# Patient Record
Sex: Female | Born: 2000 | Race: Black or African American | Marital: Single
Health system: Southern US, Community
[De-identification: ages and names within clinical notes are randomized; demographics above are authoritative.]

---
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
## 2020-04-07 ENCOUNTER — Ambulatory Visit (HOSPITAL_COMMUNITY): Payer: Self-pay

## 2020-04-26 ENCOUNTER — Ambulatory Visit (HOSPITAL_COMMUNITY): Payer: Self-pay

## 2020-06-09 ENCOUNTER — Emergency Department (HOSPITAL_COMMUNITY)
Admission: EM | Admit: 2020-06-09 | Discharge: 2020-06-09 | Disposition: A | Discharge status: Home / Self Care | Payer: Medicaid Other | Attending: Emergency Medicine | Admitting: Emergency Medicine

## 2020-06-09 ENCOUNTER — Emergency Department (HOSPITAL_COMMUNITY): Payer: Medicaid Other

## 2020-06-09 ENCOUNTER — Encounter (HOSPITAL_COMMUNITY): Payer: Self-pay | Admitting: Emergency Medicine

## 2020-06-09 ENCOUNTER — Other Ambulatory Visit: Payer: Self-pay

## 2020-06-09 DIAGNOSIS — Y92410 Unspecified street and highway as the place of occurrence of the external cause: Secondary | ICD-10-CM | POA: Diagnosis not present

## 2020-06-09 DIAGNOSIS — Z23 Encounter for immunization: Secondary | ICD-10-CM | POA: Diagnosis not present

## 2020-06-09 DIAGNOSIS — W458XXA Other foreign body or object entering through skin, initial encounter: Secondary | ICD-10-CM | POA: Insufficient documentation

## 2020-06-09 DIAGNOSIS — Z87891 Personal history of nicotine dependence: Secondary | ICD-10-CM | POA: Insufficient documentation

## 2020-06-09 DIAGNOSIS — S60452A Superficial foreign body of right middle finger, initial encounter: Secondary | ICD-10-CM | POA: Diagnosis not present

## 2020-06-09 DIAGNOSIS — S60459A Superficial foreign body of unspecified finger, initial encounter: Secondary | ICD-10-CM

## 2020-06-09 DIAGNOSIS — S6991XA Unspecified injury of right wrist, hand and finger(s), initial encounter: Secondary | ICD-10-CM | POA: Diagnosis present

## 2020-06-09 MED ORDER — TETANUS-DIPHTH-ACELL PERTUSSIS 5-2.5-18.5 LF-MCG/0.5 IM SUSY
0.5000 mL | PREFILLED_SYRINGE | Freq: Once | INTRAMUSCULAR | Status: AC
  Administered 2020-06-09: 0.5 mL via INTRAMUSCULAR
  Filled 2020-06-09: qty 0.5

## 2020-06-09 MED ORDER — ACETAMINOPHEN 325 MG PO TABS
650.0000 mg | ORAL_TABLET | Freq: Once | ORAL | Status: AC
  Administered 2020-06-09: 650 mg via ORAL
  Filled 2020-06-09: qty 2

## 2020-06-09 MED ORDER — NAPROXEN 375 MG PO TABS: 375.0000 mg | ORAL_TABLET | Freq: Two times a day (BID) | ORAL | 0 refills | Status: AC

## 2020-06-09 MED ORDER — CEPHALEXIN 500 MG PO CAPS: 500.0000 mg | ORAL_CAPSULE | Freq: Four times a day (QID) | ORAL | 0 refills | Status: AC

## 2020-06-09 MED ORDER — LIDOCAINE HCL (PF) 1 % IJ SOLN
5.0000 mL | Freq: Once | INTRAMUSCULAR | Status: AC
  Administered 2020-06-09: 5 mL
  Filled 2020-06-09: qty 5
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
## 2020-06-09 NOTE — ED Notes (Signed)
Left the pt talking to registration  After I finished with another pt  She had left the area

## 2020-06-12 ENCOUNTER — Ambulatory Visit (HOSPITAL_COMMUNITY)
Admission: EM | Admit: 2020-06-12 | Discharge: 2020-06-12 | Disposition: A | Discharge status: Home / Self Care | Payer: Medicaid Other | Attending: Urgent Care | Admitting: Urgent Care

## 2020-06-12 ENCOUNTER — Encounter (HOSPITAL_COMMUNITY): Payer: Self-pay | Admitting: Emergency Medicine

## 2020-06-12 ENCOUNTER — Other Ambulatory Visit: Payer: Self-pay

## 2020-06-12 DIAGNOSIS — Z3202 Encounter for pregnancy test, result negative: Secondary | ICD-10-CM

## 2020-06-12 DIAGNOSIS — Z87891 Personal history of nicotine dependence: Secondary | ICD-10-CM | POA: Insufficient documentation

## 2020-06-12 DIAGNOSIS — M79601 Pain in right arm: Secondary | ICD-10-CM

## 2020-06-12 DIAGNOSIS — M7918 Myalgia, other site: Secondary | ICD-10-CM | POA: Insufficient documentation

## 2020-06-12 DIAGNOSIS — S20211A Contusion of right front wall of thorax, initial encounter: Secondary | ICD-10-CM | POA: Insufficient documentation

## 2020-06-12 DIAGNOSIS — Z7251 High risk heterosexual behavior: Secondary | ICD-10-CM

## 2020-06-12 LAB — POC URINE PREG, ED: Preg Test, Ur: NEGATIVE

## 2020-06-12 MED ORDER — TIZANIDINE HCL 4 MG PO TABS: 4.0000 mg | ORAL_TABLET | Freq: Every day | ORAL | 0 refills | Status: DC

## 2020-06-12 MED ORDER — IBUPROFEN 600 MG PO TABS: 600.0000 mg | ORAL_TABLET | Freq: Four times a day (QID) | ORAL | 0 refills | Status: DC | PRN
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
## 2020-06-14 LAB — CERVICOVAGINAL ANCILLARY ONLY
Chlamydia: NEGATIVE
Comment: NEGATIVE
Comment: NEGATIVE
Comment: NORMAL
Neisseria Gonorrhea: NEGATIVE
Trichomonas: POSITIVE — AB

## 2020-06-15 ENCOUNTER — Telehealth (HOSPITAL_COMMUNITY): Payer: Self-pay | Admitting: Emergency Medicine

## 2020-06-15 MED ORDER — METRONIDAZOLE 500 MG PO TABS: 500.0000 mg | ORAL_TABLET | Freq: Two times a day (BID) | ORAL | 0 refills | Status: DC

## 2020-06-19 ENCOUNTER — Telehealth (HOSPITAL_COMMUNITY): Payer: Self-pay | Admitting: Emergency Medicine

## 2020-06-19 NOTE — Telephone Encounter (Signed)
Patient returned third phone call.  Verified identity using two identifiers.  Provided positive result.  Reviewed safe sex practices, notifying partners, and refraining from sexual activities for 7 days from time of treatment.  Patient verified understanding, all questions answered.

## 2020-10-09 ENCOUNTER — Encounter (HOSPITAL_COMMUNITY): Payer: Self-pay | Admitting: Emergency Medicine
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
## 2021-01-21 ENCOUNTER — Ambulatory Visit (HOSPITAL_COMMUNITY)
Admission: EM | Admit: 2021-01-21 | Discharge: 2021-01-21 | Disposition: A | Discharge status: Home / Self Care | Payer: Medicaid Other | Attending: Emergency Medicine | Admitting: Emergency Medicine

## 2021-01-21 ENCOUNTER — Ambulatory Visit (HOSPITAL_COMMUNITY)
Admission: EM | Admit: 2021-01-21 | Discharge: 2021-01-21 | Disposition: A | Discharge status: Home / Self Care | Payer: Medicaid Other

## 2021-01-21 ENCOUNTER — Other Ambulatory Visit: Payer: Self-pay

## 2021-01-21 ENCOUNTER — Encounter (HOSPITAL_COMMUNITY): Payer: Self-pay

## 2021-01-21 DIAGNOSIS — F321 Major depressive disorder, single episode, moderate: Secondary | ICD-10-CM | POA: Diagnosis not present

## 2021-01-21 MED ORDER — HYDROXYZINE HCL 50 MG PO TABS: 50.0000 mg | ORAL_TABLET | Freq: Four times a day (QID) | ORAL | 1 refills | Status: DC | PRN
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
## 2021-01-21 NOTE — ED Triage Notes (Signed)
Pt c/o not feeling motivated to go to work (has not gone in 2 weeks now),  some days "I don;t want to be here", crying, "thinking too much". Pt reports feeling this way for about 2 weeks.  Pt unsure if this is related to her birth control.

## 2021-01-21 NOTE — ED Provider Notes (Signed)
HPI  SUBJECTIVE:  Mckenzie Clark is a 20 y.o. female who presents with feeling depressed for the past 2 weeks.  She states that she is very emotional, crying frequently.  She states that she has not had the energy to go to work for 2 weeks.  She reports insomnia, excess guilt, rumination. She reports loss of interest in usual activities.  Her grandmother died recently and she reports increased stress at her job.  She has signed up for counseling at work.  She has tried exercise and talking to family without improvement in her symptoms.  No aggravating factors.  She states that she wishes that "I was not here in the world" sometimes.  She adamantly denies wanting to kill or hurt her self or others.  No auditory or visual hallucinations.  She has a past medical history of depression and has never been on medications for this.  No history of suicide attempts, psychiatric admissions.  She is to cut her arms about a year and a half ago.  No history of thyroid disease, diabetes.  LMP: 5/31.  Denies the possibility being pregnant.  PMD: None.   History reviewed. No pertinent past medical history.  History reviewed. No pertinent surgical history.  Family History  Problem Relation Age of Onset   Healthy Mother    Healthy Father     Social History   Tobacco Use   Smoking status: Former    Pack years: 0.00   Smokeless tobacco: Never  Substance Use Topics   Alcohol use: Never   Drug use: Not Currently    No current facility-administered medications for this encounter.  Current Outpatient Medications:    hydrOXYzine (ATARAX/VISTARIL) 50 MG tablet, Take 1 tablet (50 mg total) by mouth every 6 (six) hours as needed for anxiety. May take 2 tabs at night, Disp: 30 tablet, Rfl: 1   medroxyPROGESTERone Acetate (DEPO-PROVERA IM), Inject into the muscle., Disp: , Rfl:   No Known Allergies   ROS  As noted in HPI.   Physical Exam  BP 119/61   Pulse 76   Temp 99 F (37.2 C)   Resp 18   LMP  01/07/2021 (Approximate)   SpO2 100%   Constitutional: Well developed, well nourished, no acute distress.  Well groomed, well nourished. Eyes:  EOMI, conjunctiva normal bilaterally HENT: Normocephalic, atraumatic,mucus membranes moist. Neck: No thyroid tenderness, thyromegaly Respiratory: Normal inspiratory effort, lungs clear bilaterally Cardiovascular: Normal rate, regular rhythm, no murmurs rubs or gallop GI: nondistended skin: No rash, skin intact Musculoskeletal: no deformities Neurologic: Alert & oriented x 3, no focal neuro deficits Psychiatric: Speech and behavior appropriate.  No pressured speech, no apparent auditory visual hallucinations.  Logical, goal oriented thinking.  No suicidal or homicidal ideations.   ED Course   Medications - No data to display  No orders of the defined types were placed in this encounter.   No results found for this or any previous visit (from the past 24 hour(s)). No results found.  ED Clinical Impression  1. Current moderate episode of major depressive disorder, unspecified whether recurrent [facility])      ED Assessment/Plan  Patient with major depression.  Patient adamantly states that she has no wish to kill or hurt herself, or other people.  she "just wants to not exist".  I discussed with her that we could try some Vistaril, will start at 50 mg, may cut in half if necessary, contract for safety, and have her go to the behavioral health  urgent care now, or I could send her to the ED for psychiatric evaluation.  She has opted for the former.  She has agreed to contract for safety and understands that this is a legally binding agreement.  She will go to behavioral health urgent care tonight.  He will go to the [facility] emergency department if she changes her mind.  Work note for 2 days so that she can seek help.  Discussed MDM, treatment plan, and plan for follow-up with patient. Discussed sn/sx that should prompt return to the ED. patient  agrees with plan.   Meds ordered this encounter  Medications   hydrOXYzine (ATARAX/VISTARIL) 50 MG tablet    Sig: Take 1 tablet (50 mg total) by mouth every 6 (six) hours as needed for anxiety. May take 2 tabs at night    Dispense:  30 tablet    Refill:  1      *This clinic note was created using Scientist, clinical (histocompatibility and immunogenetics). Therefore, there may be occasional mistakes despite careful proofreading.  ?    Domenick Gong, MD 01/22/21 [phone]

## 2021-01-21 NOTE — Discharge Instructions (Addendum)
Go to behavioral health urgent care tonight.  You need help, and I think that you will be able to get it sooner there.  You can try the Vistaril 50 mg, if it is too much, you may cut it in half.  This will help you sleep.  Below is a list of primary care practices who are taking new patients for you to follow-up with.  [facility] internal medicine clinic Ground Floor - [facility], [address] [place], [place], [place] [address] [phone]  [facility] Primary Care at [facility] [address] [place], [place] [address] [phone]  [facility] [address] [place], [place] [address] [phone]  [facility] [phone] [address] [place] [address]  [facility]: [address] [place] [address]  [phone]  [facility]: [address] [place] [address]  [phone]  [facility] : [address] [place] [address] [phone]  [facility] Primary Care: [address] [place] [address] [phone]  [facility]: [address] [place] [address] [phone]  Dr. Oneal Grout [address] [facility] [place] [address]  [phone]  Go to www.goodrx.com  or www.costplusdrugs.com to look up your medications. This will give you a list of where you can find your prescriptions at the most affordable prices. Or ask the pharmacist what the cash price is, or if they have any other discount programs available to help make your medication more affordable. This can be less expensive than what you would pay with insurance.

## 2021-01-21 NOTE — ED Notes (Signed)
Martyn Ehrich, patient access - reports patient left

## 2021-01-26 ENCOUNTER — Ambulatory Visit (HOSPITAL_COMMUNITY)
Admission: EM | Admit: 2021-01-26 | Discharge: 2021-01-26 | Disposition: A | Discharge status: Home / Self Care | Payer: Medicaid Other | Attending: Family | Admitting: Family

## 2021-01-26 ENCOUNTER — Other Ambulatory Visit: Payer: Self-pay

## 2021-01-26 ENCOUNTER — Encounter (HOSPITAL_COMMUNITY): Payer: Self-pay

## 2021-01-26 ENCOUNTER — Ambulatory Visit (HOSPITAL_COMMUNITY)
Admission: EM | Admit: 2021-01-26 | Discharge: 2021-01-26 | Disposition: A | Discharge status: Home / Self Care | Payer: Medicaid Other

## 2021-01-26 DIAGNOSIS — F3289 Other specified depressive episodes: Secondary | ICD-10-CM | POA: Diagnosis not present

## 2021-01-26 DIAGNOSIS — F339 Major depressive disorder, recurrent, unspecified: Secondary | ICD-10-CM | POA: Insufficient documentation

## 2021-01-26 DIAGNOSIS — G4700 Insomnia, unspecified: Secondary | ICD-10-CM | POA: Diagnosis not present

## 2021-01-26 DIAGNOSIS — F320 Major depressive disorder, single episode, mild: Secondary | ICD-10-CM

## 2021-01-26 MED ORDER — TRAZODONE HCL 50 MG PO TABS
50.0000 mg | ORAL_TABLET | Freq: Every day | ORAL | 0 refills | Status: DC
Start: 2021-01-26 — End: 2021-09-04

## 2021-01-26 MED ORDER — FLUOXETINE HCL 10 MG PO CAPS
10.0000 mg | ORAL_CAPSULE | Freq: Every day | ORAL | 0 refills | Status: DC
Start: 2021-01-26 — End: 2021-09-04

## 2021-01-26 MED ORDER — FLUOXETINE HCL 10 MG PO CAPS
10.0000 mg | ORAL_CAPSULE | Freq: Every day | ORAL | 0 refills | Status: DC
Start: 2021-01-26 — End: 2021-01-26

## 2021-01-26 NOTE — ED Triage Notes (Signed)
Pt in with c/o depression x 2 weeks  States that she doesn't want to kill herself , but "some times I just feel like I don't want to be in this world anymore"   Pt currently not on any medication  States her grandmother recently passed away   Pt states she feels like no one cares about her

## 2021-01-26 NOTE — Discharge Instructions (Addendum)
Take all medications as prescribed. Keep all follow-up appointments as scheduled.  Do not consume alcohol or use illegal drugs while on prescription medications. Report any adverse effects from your medications to your primary care provider promptly.  In the event of recurrent symptoms or worsening symptoms, call 911, a crisis hotline, or go to the nearest emergency department for evaluation.   

## 2021-01-26 NOTE — ED Provider Notes (Signed)
Behavioral Health Urgent Care Medical Screening Exam  Patient Name: Mckenzie Clark MRN: [id number] Date of Evaluation: 01/26/21 Chief Complaint:   Diagnosis:  Final diagnoses:  None    History of Present illness: Mckenzie Clark is a 20 y.o. female.  Presents due to worsening depression.  She reports " I have been depressed for quite a while."  Denied suicidal.  Denies plan or intent.  Does report passive thoughts.  Denied previous inpatient admissions.  Denied that she has been followed by psychiatry and/or therapy in the past.  Dates she was recently evaluated at [facility] urgent care due to depression and was advised to follow-up here.  States she has not been resting well at night.  Due to the passing of her grandmother which intensified her depression symptoms.  Reports a family history of mental illness.  States her brother, using drugs and now " talks to his self."  She denied illicit drug or substance abuse history.   Patient was initiated on hydroxyzine however states the medication has not been helpful.  Discussed following up with open access for medication management.  Will initiate Prozac 10 mg and hydroxyzine 50 mg p.o. nightly.  Patient to follow-up with Intensive Outpatient program.  Support, encouragement and reassurance was provided.  Psychiatric Specialty Exam  Presentation  General Appearance:Appropriate for Environment  Eye Contact:Good  Speech:Clear and Coherent  Speech Volume:Decreased  Handedness:Left   Mood and Affect  Mood:Anxious; Depressed  Affect:Congruent   Thought Process  Thought Processes:Coherent  Descriptions of Associations:Intact  Orientation:Full (Time, Place and Person)  Thought Content:Logical    Hallucinations:None  Ideas of Reference:None  Suicidal Thoughts:No (passive ideations, denied intent or plan)  Homicidal Thoughts:No   Sensorium  Memory:Immediate Fair; Recent Fair; Remote  Fair  Judgment:Fair  Insight:Good   Executive Functions  Concentration:Fair  Attention Span:Fair  Recall:Fair  Fund of Knowledge:Good  Language:Good   Psychomotor Activity  Psychomotor Activity:Normal   Assets  Assets:Social Support   Sleep  Sleep:Fair  Number of hours:  No data recorded  Nutritional Assessment (For OBS and FBC admissions only) Has the patient had a weight loss or gain of 10 pounds or more in the last 3 months?: No Has the patient had a decrease in food intake/or appetite?: No Does the patient have dental problems?: No Does the patient have eating habits or behaviors that may be indicators of an eating disorder including binging or inducing vomiting?: No Has the patient recently lost weight without trying?: No Has the patient been eating poorly because of a decreased appetite?: No Malnutrition Screening Tool Score: 0    Physical Exam: Physical Exam ROS Blood pressure 122/85, pulse 73, last menstrual period 01/07/2021, SpO2 100 %. There is no height or weight on file to calculate BMI.  Musculoskeletal: Strength & Muscle Tone: within normal limits Gait & Station: normal Patient leans: N/A   [facility] MSE Discharge Disposition for Follow up and Recommendations: Based on my evaluation the patient does not appear to have an emergency medical condition and can be discharged with resources and follow up care in outpatient services for Medication Management, Partial Hospitalization Program, and Group Therapy  - Patient to follow-up with Jeri Modena and start intensive outpatient programming (IOP) -Keep follow-up appointment with therapist Darren  -Patient was initiated on Prozac 10 mg  and Trazodone 50 mg   Oneta Rack, NP 01/26/2021, 3:09 PM

## 2021-01-26 NOTE — Progress Notes (Signed)
   01/26/21 1514  [facility] Triage Screening (Walk-ins at [facility] only)  How Did You Hear About [place]? Self  What Is the Reason for Your Visit/Call Today? Tahliyah is a 20yo female reporting to [facility] for evaluation of depression symptom and insomnia. Pt reports that she has been feeling more depressed since she started birth control pills a few months ago. Pt says she is experiencing the following symptoms of depression: sad, depressed mood, lack of motivation, lack of initiative. Pt feels that her symptoms are impacting her sleep and daily work/social functioning. Pt reports good support system: boyfriend and friends.  Pt reports some SI (thoughts with no plans), no HI, and no AVH.   Pt does not feel she is currently a danger to herself or others. Pt is interested in outpatient resources/treatment.  How Long Has This Been Causing You Problems? 1-6 months  Have You Recently Had Any Thoughts About Hurting Yourself? Yes  How long ago did you have thoughts about hurting yourself? recently had thoughts about "not being here"  Are You Planning to Commit Suicide/Harm Yourself At This time? No  Have you Recently Had Thoughts About Hurting Someone Karolee Ohs? No  Are You Planning To Harm Someone At This Time? No  Are you currently experiencing any auditory, visual or other hallucinations? No  Have You Used Any Alcohol or Drugs in the Past 24 Hours? No  Do you have any current medical co-morbidities that require immediate attention? No  What Do You Feel Would Help You the Most Today? Treatment for Depression or other mood problem  If access to [facility] Care was not available, would you have sought care in the Emergency Department? Yes  Determination of Need Routine (7 days)  Options For Referral Outpatient Therapy;Medication Management

## 2021-01-26 NOTE — Discharge Instructions (Addendum)
Go to [facility] Urgent Care

## 2021-01-26 NOTE — ED Provider Notes (Signed)
[facility]    CSN: [id number] Arrival date & time: 01/26/21  1355      History   Chief Complaint Chief Complaint  Patient presents with   Depression    HPI Mckenzie Clark is a 20 y.o. female.   Reports feeling depressed and low. States that she feels like no one really cares about her right now. States that her grandmother recently died and she has not been sleeping either. Denies previous symptoms. Has not attempted OTC treatment. Denies current SI/HI but does state that the world would be "better off without her." No plan to harm herself or others. Was seen in this office and treated with hydroxyzine for anxiety x 5 days ago. Reports that this has not been very helpful. Reports that she has enrolled in Employee Assistance Program to get into counseling. Reports that her appointment is not until next month. Denies current headache, chills, abdominal pain, nausea, vomiting, diarrhea, rash, fever, other symptoms.  ROS per HPI  The history is provided by the patient.  Depression   History reviewed. No pertinent past medical history.  There are no problems to display for this patient.   History reviewed. No pertinent surgical history.  OB History   No obstetric history on file.      Home Medications    Prior to Admission medications   Medication Sig Start Date End Date Taking? Authorizing Provider  hydrOXYzine (ATARAX/VISTARIL) 50 MG tablet Take 1 tablet (50 mg total) by mouth every 6 (six) hours as needed for anxiety. May take 2 tabs at night 01/21/21   Domenick Gong, MD  medroxyPROGESTERone Acetate (DEPO-PROVERA IM) Inject into the muscle.    [provider]    Family History Family History  Problem Relation Age of Onset   Healthy Mother    Healthy Father     Social History Social History   Tobacco Use   Smoking status: Former    Pack years: 0.00   Smokeless tobacco: Never  Substance Use Topics   Alcohol use: Never   Drug use:  Not Currently     Allergies   Patient has no known allergies.   Review of Systems Review of Systems  Psychiatric/Behavioral:  Positive for depression.     Physical Exam Triage Vital Signs ED Triage Vitals  Enc Vitals Group     BP 01/26/21 1405 123/75     Pulse Rate 01/26/21 1405 98     Resp 01/26/21 1405 17     Temp 01/26/21 1405 98.9 F (37.2 C)     Temp Source 01/26/21 1405 Oral     SpO2 01/26/21 1405 100 %     Weight --      Height --      Head Circumference --      Peak Flow --      Pain Score 01/26/21 1404 0     Pain Loc --      Pain Edu? --      Excl. in GC? --    No data found.  Updated Vital Signs BP 123/75 (BP Location: Right Arm)   Pulse 98   Temp 98.9 F (37.2 C) (Oral)   Resp 17   LMP 01/07/2021 (Approximate)   SpO2 100%   Visual Acuity Right Eye Distance:   Left Eye Distance:   Bilateral Distance:    Right Eye Near:   Left Eye Near:    Bilateral Near:     Physical Exam Vitals and nursing note  reviewed.  Constitutional:      General: She is not in acute distress.    Appearance: Normal appearance. She is well-developed.  HENT:     Head: Normocephalic and atraumatic.     Nose: Nose normal.     Mouth/Throat:     Mouth: Mucous membranes are moist.     Pharynx: Oropharynx is clear.  Eyes:     Extraocular Movements: Extraocular movements intact.     Conjunctiva/sclera: Conjunctivae normal.     Pupils: Pupils are equal, round, and reactive to light.  Cardiovascular:     Rate and Rhythm: Normal rate and regular rhythm.  Pulmonary:     Effort: Pulmonary effort is normal. No respiratory distress.  Musculoskeletal:        General: Normal range of motion.     Cervical back: Normal range of motion and neck supple.  Skin:    General: Skin is warm and dry.     Capillary Refill: Capillary refill takes less than 2 seconds.  Neurological:     General: No focal deficit present.     Mental Status: She is alert and oriented to person, place, and  time.  Psychiatric:        Mood and Affect: Mood normal.        Behavior: Behavior is withdrawn.        Thought Content: Thought content normal. Thought content is not paranoid or delusional. Thought content does not include homicidal or suicidal ideation. Thought content does not include homicidal or suicidal plan.     UC Treatments / Results  Labs (all labs ordered are listed, but only abnormal results are displayed) Labs Reviewed - No data to display  EKG   Radiology No results found.  Procedures Procedures (including critical care time)  Medications Ordered in UC Medications - No data to display  Initial Impression / Assessment and Plan / UC Course  I have reviewed the triage vital signs and the nursing notes.  Pertinent labs & imaging results that were available during my care of the patient were reviewed by me and considered in my medical decision making (see chart for details).    Depression Insomnia  Discussed that if the hydroxyzine was not helpful that she does need to be evaluated by behavioral health Discussed that there is a 24/7 behavioral health urgent care in town and that they should be able to see her today She is in agreement to follow up there Low concern for immediate risk to self or others Get established with PCP as well Follow up in the ER if symptoms worsen  Final Clinical Impressions(s) / UC Diagnoses   Final diagnoses:  Insomnia, unspecified type  Other depression     Discharge Instructions      Go to Behavioral Health Urgent Care     ED Prescriptions   None    PDMP not reviewed this encounter.   Moshe Cipro, NP 01/26/21 1437

## 2021-01-26 NOTE — Discharge Summary (Signed)
Mckenzie Clark to be D/C'd Home per NP order. Discussed with the patient and all questions fully answered. An After Visit Summary was printed and given to the patient.  Patient escorted out and D/C home via private auto.  Dickie La  01/26/2021 3:24 PM

## 2021-01-27 ENCOUNTER — Encounter: Payer: Self-pay | Admitting: *Deleted

## 2021-01-28 ENCOUNTER — Telehealth (HOSPITAL_COMMUNITY): Payer: Self-pay | Admitting: Psychiatry

## 2021-01-28 NOTE — Telephone Encounter (Signed)
D:  Hillery Jacks, NP referred pt to [facility]-IOP.  A:  Placed call to pt's cell #; a gentleman answered stating that pt wasn't available.  Left message that case manager from [facility] called.  Melton Krebs states he will inform her.  Inform Hillery Jacks, NP.

## 2021-01-31 ENCOUNTER — Ambulatory Visit (HOSPITAL_COMMUNITY): Payer: Self-pay | Admitting: Clinical

## 2021-03-18 ENCOUNTER — Other Ambulatory Visit: Payer: Self-pay

## 2021-03-18 DIAGNOSIS — Z79899 Other long term (current) drug therapy: Secondary | ICD-10-CM | POA: Insufficient documentation

## 2021-03-18 DIAGNOSIS — F331 Major depressive disorder, recurrent, moderate: Secondary | ICD-10-CM | POA: Insufficient documentation

## 2021-03-18 DIAGNOSIS — Z9152 Personal history of nonsuicidal self-harm: Secondary | ICD-10-CM | POA: Insufficient documentation

## 2021-03-18 NOTE — Progress Notes (Signed)
TRIAGE: ROUTINE   03/18/21 2100  [facility] Triage Screening (Walk-ins at [facility] only)  How Did You Hear About [place]? Self  What Is the Reason for Your Visit/Call Today? Pt shares, "I've been deprssed, feeing down. I have no energy to do anything. I'm moody. I'm having thoughts I don't want to be here. I haven't been to work in a couple of months; they say they understand but I don't want to lose my job. I had these thoughts before when I was [age] and on birth control. This time is started when I came back from a trip to [place] with my boyfriend; I got back and now I'm feeling down."  How Long Has This Been Causing You Problems? 1-6 months  Have You Recently Had Any Thoughts About Hurting Yourself? No  How long ago did you have thoughts about hurting yourself? N/A  Are You Planning to Commit Suicide/Harm Yourself At This time? No  Have you Recently Had Thoughts About Hurting Someone Karolee Ohs? No  Are You Planning To Harm Someone At This Time? No  Are you currently experiencing any auditory, visual or other hallucinations? No  Have You Used Any Alcohol or Drugs in the Past 24 Hours? No  Do you have any current medical co-morbidities that require immediate attention? No  Clinician description of patient physical appearance/behavior: Pt is dressed in an age-appropriate manner. She is able to express her thoughts, feelings, and concerns in an open manner.  What Do You Feel Would Help You the Most Today? Treatment for Depression or other mood problem;Medication(s)  If access to [facility] Urgent Care was not available, would you have sought care in the Emergency Department? No  Determination of Need Routine (7 days)  Options For Referral Medication Management;Outpatient Therapy

## 2021-03-18 NOTE — Progress Notes (Deleted)
Pt left the lobby without being seen by provider or TTS.

## 2021-03-19 ENCOUNTER — Ambulatory Visit (HOSPITAL_COMMUNITY)
Admission: EM | Admit: 2021-03-19 | Discharge: 2021-03-19 | Disposition: A | Discharge status: Home / Self Care | Payer: Medicaid Other | Attending: Psychiatry | Admitting: Psychiatry

## 2021-03-19 ENCOUNTER — Encounter (HOSPITAL_COMMUNITY): Payer: Self-pay

## 2021-03-19 DIAGNOSIS — F331 Major depressive disorder, recurrent, moderate: Secondary | ICD-10-CM

## 2021-03-19 NOTE — Discharge Instructions (Signed)
  Discharge recommendations:  Patient is to take medications as prescribed. Please see information for follow-up appointment with psychiatry and therapy. Please follow up with your primary care provider for all medical related needs.   Therapy: We recommend that patient participate in individual therapy to address mental health concerns.  Medications: The patient is to contact a medical professional and/or outpatient provider to address any new side effects that develop. Patient should update outpatient providers of any new medications and/or medication changes.   Safety:  The patient should abstain from use of illicit substances/drugs and abuse of any medications. If symptoms worsen or do not continue to improve or if the patient becomes actively suicidal or homicidal then it is recommended that the patient return to the closest hospital emergency department, the [facility], or call 911 for further evaluation and treatment. National Suicide Prevention Lifeline 1-800-SUICIDE or 1-800-273-8255.  

## 2021-03-19 NOTE — ED Provider Notes (Addendum)
Behavioral Health Urgent Care Medical Screening Exam  Patient Name: Mckenzie Clark MRN: [id number] Date of Evaluation: 03/19/21 Chief Complaint:  "Feeling down" Diagnosis:  Final diagnoses:  MDD (major depressive disorder), recurrent episode, moderate (HCC)    History of Present illness: Mckenzie Clark is a 20 y.o. female with past documented psychiatric history of MDD who presents to [facility] unaccompanied as a voluntary walk-in.  When patient is asked why she came to [facility], patient states "I've been feeling down lately, depressed, I have no energy to go to work. I have been sleeping a lot lately. I'm just not myself".  Patient states that she began feeling depressed and anxious about 2 months ago after she returned from a vacation with her boyfriend.  Patient states that there were no specific triggers on this vacation or after this trip that she believes caused her to begin feeling depressed and anxious.  Patient does state that she has been on a break from her boyfriend for the past 2 weeks.  Patient denies any history of being physically or verbally or sexually abused by her boyfriend.  Patient states that she has been to [facility] in the past and was prescribed medication for depression at that time.  Per chart review of this patient's chart (under Luther Bradley), there is no documentation of any past [facility] encounters for the patient. Upon further investigation, it appears that a second chart was created for the patient this evening by mistake, as my chart review shows that the patient has a separate/second chart (under Arlyn Leak, same birthdate as this current chart).  Patient's second chart has been marked for merge with this current chart that this encounter is documented under. Upon chart review of this second chart, it appears that patient presented to [facility] on 01/26/2021 for depression/anxiety and was initiated on/provided prescriptions for fluoxetine 10 mg p.o. daily and trazodone 50 mg p.o. daily at  bedtime.  Chart review of this 01/26/2021 behavioral health urgent care encounter also shows that patient's information was provided to [facility] IOP program. Per chart review, it appears that patient was contacted via phone by [facility] program regarding the IOP program, but it was documented that a female answered the phone and stated that the patient was not available at that time.  Patient states that she does not recall receiving a phone call from [facility] behavioral health and she states that she is using her mother's phone currently and that that may be why.  Patient states that she did pick up the fluoxetine 10 mg from her pharmacy at that time, but she states that she did not pick up the trazodone from her pharmacy. She states she is not sure why she did not pick up the Trazodone. Patient states that she took the fluoxetine 10 mg daily for a few days after picking up the prescription in June 2022, the patient states that she stopped taking the medication after only taking it for a few days in June.  Patient states that she stopped taking the fluoxetine because her mother told her that she did not need to be taking any medication for mental health.  Patient states that her mother has a negative stigma towards mental health and tells the patient that she "just needs to pray" and that the patient does not need to take medication for mental health or see a therapist.  Patient states that she still has most of her fluoxetine prescription at home.  She denies experiencing any adverse  effects while she is taking the fluoxetine, but does acknowledge that she did not take the medication long enough to determine if it was helping her mental health symptoms or not.  Chart review of patient's second chart (under Arlyn Leak) also shows that patient went to [facility] urgent care on 02/18/2021 for symptoms of depression and was recommended by the urgent care provider to take the  medication she was prescribed at the behavior of the urgent care on 01/26/2021 (see above for details) as well as to follow up with outpatient psychiatric provider.  Patient denies SI.  She does endorse experiencing mild passive SI 2 days ago with passive thoughts of not wanting to be alive, but patient denies any active SI or suicidal plans or suicidal intent.  Patient denies history of past suicide attempts.  Patient reports history of self-injurious behavior via cutting, but patient states that she has not cut herself or engaged in any self-injurious behavior for 1 year now.  Patient states that the last time she engaged in self-injurious behaviors when she cut her left forearm 1 year ago with a kitchen knife (she states that she cut herself for a few days 1 year ago and then completely stopped).  She states that her intention behind cutting herself at that time was to make herself feel better/relieve stress and she states that none of these cutting incidents were suicide attempts.  She denies any history of intentionally burning herself.  Patient denies HI, AVH, paranoia, or delusions.  Patient reports sleeping poorly and states that sometimes she only sleeps about 1 hour per night due to her depression and anxiety.  She endorses anhedonia, as well as feelings of hopelessness.  She reports fatigue and denies concentration changes.  She reports decreased appetite, but she attributes this to being off of her birth control for the past month.  She denies weight changes.  Patient is not taking any psychotropic medications at this time.  Patient does not have a psychiatrist or therapist at this time.  Patient denies any history of being psychiatrically hospitalized in the past.  She reports that she lives in [place] currently with her mother, 63-year-old brother, and maternal grandfather.  Patient denies access to firearms or weapons.  Patient reports drinking alcohol a few times per month and she states that she  last drank alcohol a few weeks ago.  She denies history of alcohol withdrawal symptoms, delirium tremens, or seizures.  She denies alcohol or illicit drug use.  She states that her main sources of support are her sisters, her friend, and her mother occasionally, although she does endorse that her mother has a negative stigma towards mental health (see details above).  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  Patient states that she has not been to work for about 1 month now due to her struggles with her depression and anxiety.  Patient states that she did go to work on July 1 and she states that prior to July 1 she had been out of work for about 1 month as well due to going on vacation and also for depression and anxiety.  Patient states that she still does have her job and that her work has been contacted and are asking her when she will be able to return.  She states that she feels like she can return to work and that she would like to return to work on Thursday,  March 21, 2021 as she states that Tuesdays and Wednesdays are usually her off days.  Psychiatric Specialty Exam  Presentation  General Appearance:Appropriate for Environment; Well Groomed  Eye Contact:Good  Speech:Clear and Coherent; Normal Rate  Speech Volume:Decreased  Handedness:No data recorded  Mood and Affect  Mood:Depressed  Affect:Congruent; Flat   Thought Process  Thought Processes:Coherent; Goal Directed; Linear  Descriptions of Associations:Intact  Orientation:Full (Time, Place and Person)  Thought Content:WDL; Logical    Hallucinations:None  Ideas of Reference:None  Suicidal Thoughts:No  Homicidal Thoughts:No   Sensorium  Memory:Immediate Fair; Recent Fair; Remote Fair  Judgment:Good  Insight:Good   Executive Functions  Concentration:Good  Attention Span:Good  Recall:Good  Fund of Knowledge:Good  Language:Good   Psychomotor Activity   Psychomotor Activity:Normal   Assets  Assets:Communication Skills; Desire for Improvement; Financial Resources/Insurance; Housing; Leisure Time; Physical Health; Social Support; Vocational/Educational; Transportation   Sleep  Sleep:Poor  Number of hours:  No data recorded  No data recorded  Physical Exam: Physical Exam Vitals reviewed.  Constitutional:      General: She is not in acute distress.    Appearance: Normal appearance. She is not ill-appearing, toxic-appearing or diaphoretic.  HENT:     Head: Normocephalic and atraumatic.     Right Ear: External ear normal.     Left Ear: External ear normal.     Nose: Nose normal.  Eyes:     General:        Right eye: No discharge.        Left eye: No discharge.     Conjunctiva/sclera: Conjunctivae normal.  Cardiovascular:     Rate and Rhythm: Normal rate.  Pulmonary:     Effort: Pulmonary effort is normal. No respiratory distress.  Musculoskeletal:        General: Normal range of motion.     Cervical back: Normal range of motion.  Neurological:     General: No focal deficit present.     Mental Status: She is alert and oriented to person, place, and time.     Comments: No tremor noted.   Psychiatric:        Mood and Affect: Mood is depressed.        Speech: Speech normal.        Behavior: Behavior normal. Behavior is not agitated, slowed, aggressive, withdrawn, hyperactive or combative. Behavior is cooperative.        Thought Content: Thought content is not paranoid or delusional. Thought content does not include homicidal or suicidal ideation.     Comments: Affect mood congruent and flat.    Review of Systems  Constitutional:  Positive for malaise/fatigue. Negative for chills, diaphoresis, fever and weight loss.  HENT:  Negative for congestion.   Respiratory:  Negative for cough and shortness of breath.   Cardiovascular:  Negative for chest pain and palpitations.  Gastrointestinal:  Negative for abdominal pain,  constipation, diarrhea, nausea and vomiting.  Musculoskeletal:  Negative for joint pain and myalgias.  Neurological:  Negative for dizziness, tremors, seizures and headaches.  Psychiatric/Behavioral:  Positive for depression. Negative for hallucinations, memory loss, substance abuse and suicidal ideas. The patient is nervous/anxious and has insomnia.   All other systems reviewed and are negative.  Vitals: Blood pressure 116/75, pulse 72, temperature 98.4 F (36.9 C), temperature source Oral, resp. rate 16, SpO2 100 %. There is no height or weight on file to calculate BMI.  Musculoskeletal: Strength & Muscle Tone: within normal limits Gait & Station: normal Patient leans: N/A  [facility] MSE Discharge Disposition for Follow up and Recommendations: Based on my evaluation the patient does not appear to have an emergency medical condition and can be discharged with resources and follow up care in outpatient services for Medication Management, Individual Therapy, and Group Therapy    Patient denies SI, HI, AVH, paranoia, or delusions.  Patient is not psychotic on exam.  Patient is not an imminent threat/risk to herself or others.  Patient does not meet inpatient psychiatric treatment criteria, overnight observation criteria, or [facility] FBC criteria.   Patient is not currently established with an outpatient psychiatrist or therapist and is not taking any psychotropic medications at this time.  Patient states that she did pick up the fluoxetine 10 mg from her pharmacy that she was prescribed at [facility] on 01/26/21, but she states that she did not pick up the trazodone from her pharmacy.  Patient states that she took the fluoxetine 10 mg daily for a few days after picking up the prescription in June 2022, the patient states that she stopped taking the medication after only taking it for a few days in June.  Patient states that she stopped taking the fluoxetine because her mother told her that she did not need to  be taking any medication for mental health.  Patient states that her mother has a negative stigma towards mental health and tells the patient that she "just needs to pray" and that the patient does not need to take medication for mental health or see a therapist.  Patient states that she still has most of her fluoxetine prescription at home.  She denies experiencing any adverse effects while she is taking the fluoxetine, but does acknowledge that she did not take the medication long enough to determine if it was helping her mental health symptoms or not.  Recommend that patient become established with an outpatient psychiatrist and therapist.  Patient states that she would like to become established with a psychiatrist and therapist. I discussed the following with the patient at length: the validity of patient's mental health issues, that it is completely fine and acceptable to have mental health struggles, provided support and reassurance regarding patient's doubts regarding her mental health, and the importance of the patient doing what is in the best interest of her health and mental health despite the negative stigma that her mother has towards mental health.  Patient verbalized understanding and agreement of this.  Patient lives in [place] and has [place].  Thus, patient meets criteria for [facility] open access hours.  Due to patient not being able to follow up with IOP in the past as well as her limited psychotropic medication compliance in the recent past, recommend that patient attend [facility] second-floor open access/walk-in hours for psychiatry/medication management and therapy appointments in order to become established with outpatient psychiatric providers, as this appears to be the quickest option for patient to begin receiving outpatient psychiatric care at this time.  Patient is agreeable to going to open access hours at  [facility] second-floor for psychiatry and therapy and she states that she will attend open access hours on 03/19/21 or 03/20/21 before she returns to work on 03/21/2021.  [facility] Monday through Friday open access/walk-in schedule/hours provided to the patient.  Recommend that patient restart her fluoxetine 10 mg p.o. daily as previously prescribed on 01/26/2021 until patient can be seen by an outpatient psychiatrist at [facility] open access hours and can then discuss continuing or adjusting this psychotropic medication regimen at that time of outpatient evaluation.  Patient reeducated on side effect profile fluoxetine and patient verbalized understanding of this education.  Patient agreeable to restarting fluoxetine.  Notified the patient that patient could discuss additional medications for sleep/anxiety with outpatient psychiatric provider at second-floor open access hours and patient verbalized understanding and agreement of this plan.  Additional [facility] of [facility] provided to the patient for the patient to utilize to schedule psychiatry and therapy appointments in the case that patient is unable to [facility].  Patient states that she feels like she can return to work and that she would like to return to work on Thursday, March 21, 2021 as she states that Tuesdays and Wednesdays are usually her off days.  Patient is asking for a work excuse stating that she can return to work on 03/21/2021.  With recommendations noted above and safety planning in place, I feel safe letting the patient return to work on 03/21/21 at this time with my above recommendations and safety planning (below) in place.  Work excuse provided for the patient stating [facility] and may return to work on 03/21/2021.   Patient  verbally contracts for safety with this Clinical research associatewriter.  Patient states that if she is to return home this evening, she will not try to harm or kill herself. Patient denies any safety concerns regarding returning home at this time.   Safety planning done at length with the patient regarding appropriate actions to take/resources to utilize [facility], nearest ED, 911, suicide prevention Lifeline) if the patient becomes suicidal or homicidal, if the patient's condition rapidly deteriorates/worsens/does not improve, or if the patient begins to experience a mental health crisis.   Patient verbalizes understanding and agreement of the overall plan and recommendations, including safety plan. All patient's questions answered and concerns addressed. Patient discharged home.     Jaclyn Shaggyody W Kimm Ungaro, PA-C 03/19/2021, 1:38 AM

## 2021-04-15 ENCOUNTER — Other Ambulatory Visit: Payer: Self-pay

## 2021-04-15 ENCOUNTER — Ambulatory Visit (HOSPITAL_COMMUNITY)
Admission: EM | Admit: 2021-04-15 | Discharge: 2021-04-15 | Disposition: A | Discharge status: Home / Self Care | Payer: Medicaid Other | Attending: Student | Admitting: Student

## 2021-04-15 ENCOUNTER — Encounter (HOSPITAL_COMMUNITY): Payer: Self-pay

## 2021-04-15 DIAGNOSIS — Z113 Encounter for screening for infections with a predominantly sexual mode of transmission: Secondary | ICD-10-CM | POA: Diagnosis not present

## 2021-04-15 DIAGNOSIS — N760 Acute vaginitis: Secondary | ICD-10-CM | POA: Diagnosis not present

## 2021-04-15 MED ORDER — FLUCONAZOLE 150 MG PO TABS: 150.0000 mg | ORAL_TABLET | Freq: Every day | ORAL | 0 refills | Status: DC

## 2021-04-15 NOTE — ED Triage Notes (Signed)
Pt reports vaginal itchy x 2-3 days.  Pt requested STD's test.

## 2021-04-15 NOTE — Discharge Instructions (Addendum)
-  For your yeast infection, start the Diflucan (fluconazole)- Take one pill today (day 1). If you're still having symptoms in 3 days, take the second pill.  -We have sent testing for sexually transmitted infections. We will notify you of any positive results once they are received. If required, we will prescribe any medications you might need. Please refrain from all sexual activity until treatment is complete.  -Seek additional medical attention if you develop fevers/chills, new/worsening abdominal pain, new/worsening vaginal discomfort/discharge, etc.   

## 2021-04-15 NOTE — ED Provider Notes (Signed)
[facility]    CSN: [id number] Arrival date & time: 04/15/21  1531      History   Chief Complaint Chief Complaint  Patient presents with   Vaginal Itching    HPI Mckenzie Clark is a 20 y.o. female presenting with vaginal itching x3 days, requesting STI screen. Medical history trichomonas 06/2020, BV. Denies hematuria, dysuria, frequency, urgency, back pain, n/v/d/abd pain, fevers/chills, abdnormal vaginal discharge, vaginal rashes/lesions. Denies new partners. LMP 1 week ago, states she is not pregnant.    HPI  History reviewed. No pertinent past medical history.  There are no problems to display for this patient.   History reviewed. No pertinent surgical history.  OB History   No obstetric history on file.      Home Medications    Prior to Admission medications   Medication Sig Start Date End Date Taking? Authorizing Provider  fluconazole (DIFLUCAN) 150 MG tablet Take 1 tablet (150 mg total) by mouth daily. -For your yeast infection, start the Diflucan (fluconazole)- Take one pill today (day 1). If you're still having symptoms in 3 days, take the second pill. 04/15/21  Yes Rhys Martini, PA-C  FLUoxetine (PROZAC) 10 MG capsule Take 1 capsule (10 mg total) by mouth daily. 01/26/21 01/26/22  Oneta Rack, NP  hydrOXYzine (ATARAX/VISTARIL) 50 MG tablet Take 1 tablet (50 mg total) by mouth every 6 (six) hours as needed for anxiety. May take 2 tabs at night 01/21/21   Domenick Gong, MD  medroxyPROGESTERone Acetate (DEPO-PROVERA IM) Inject into the muscle.    [provider]  traZODone (DESYREL) 50 MG tablet Take 1 tablet (50 mg total) by mouth at bedtime. 01/26/21   Oneta Rack, NP    Family History Family History  Problem Relation Age of Onset   Healthy Mother    Healthy Father     Social History Social History   Tobacco Use   Smoking status: Former   Smokeless tobacco: Never  Substance Use Topics   Alcohol use: Never   Drug use:  Not Currently     Allergies   Patient has no known allergies.   Review of Systems Review of Systems  Constitutional:  Negative for chills and fever.  HENT:  Negative for sore throat.   Eyes:  Negative for pain and redness.  Respiratory:  Negative for shortness of breath.   Cardiovascular:  Negative for chest pain.  Gastrointestinal:  Negative for abdominal pain, diarrhea, nausea and vomiting.  Genitourinary:  Negative for decreased urine volume, difficulty urinating, dysuria, flank pain, frequency, genital sores, hematuria, urgency, vaginal bleeding, vaginal discharge and vaginal pain.  Musculoskeletal:  Negative for back pain.  Skin:  Negative for rash.  All other systems reviewed and are negative.   Physical Exam Triage Vital Signs ED Triage Vitals  Enc Vitals Group     BP 04/15/21 1626 109/61     Pulse Rate 04/15/21 1626 82     Resp 04/15/21 1626 16     Temp 04/15/21 1626 99 F (37.2 C)     Temp Source 04/15/21 1626 Oral     SpO2 04/15/21 1626 96 %     Weight --      Height --      Head Circumference --      Peak Flow --      Pain Score 04/15/21 1624 0     Pain Loc --      Pain Edu? --      Excl.  in GC? --    No data found.  Updated Vital Signs BP 109/61 (BP Location: Right Arm)   Pulse 82   Temp 99 F (37.2 C) (Oral)   Resp 16   LMP  (Within Weeks) Comment: 1 week  SpO2 96%   Visual Acuity Right Eye Distance:   Left Eye Distance:   Bilateral Distance:    Right Eye Near:   Left Eye Near:    Bilateral Near:     Physical Exam Vitals reviewed.  Constitutional:      General: She is not in acute distress.    Appearance: Normal appearance. She is not ill-appearing.  HENT:     Head: Normocephalic and atraumatic.     Mouth/Throat:     Mouth: Mucous membranes are moist.     Comments: Moist mucous membranes Eyes:     Extraocular Movements: Extraocular movements intact.     Pupils: Pupils are equal, round, and reactive to light.  Cardiovascular:      Rate and Rhythm: Normal rate and regular rhythm.     Heart sounds: Normal heart sounds.  Pulmonary:     Effort: Pulmonary effort is normal.     Breath sounds: Normal breath sounds. No wheezing, rhonchi or rales.  Abdominal:     General: Bowel sounds are normal. There is no distension.     Palpations: Abdomen is soft. There is no mass.     Tenderness: There is no abdominal tenderness. There is no right CVA tenderness, left CVA tenderness, guarding or rebound.  Genitourinary:    Comments: deferred Skin:    General: Skin is warm.     Capillary Refill: Capillary refill takes less than 2 seconds.     Comments: Good skin turgor  Neurological:     General: No focal deficit present.     Mental Status: She is alert and oriented to person, place, and time.  Psychiatric:        Mood and Affect: Mood normal.        Behavior: Behavior normal.     UC Treatments / Results  Labs (all labs ordered are listed, but only abnormal results are displayed) Labs Reviewed  CERVICOVAGINAL ANCILLARY ONLY    EKG   Radiology No results found.  Procedures Procedures (including critical care time)  Medications Ordered in UC Medications - No data to display  Initial Impression / Assessment and Plan / UC Course  I have reviewed the triage vital signs and the nursing notes.  Pertinent labs & imaging results that were available during my care of the patient were reviewed by me and considered in my medical decision making (see chart for details).     This patient is a very pleasant 20 y.o. year old female presenting with suspected vaginitis. Afebrile, nontachycardic, no reproducible abd pain or CVAT. States she is not pregnant or breastfeeding, LMP 1 week ago.  History trichomonas per chart review.  Denies STI risk. Will send self-swab for G/C, trich, yeast, BV testing. Declines HIV, RPR. Safe sex precautions.   Diflucan sent as below.  ED return precautions discussed. Patient verbalizes  understanding and agreement.   Coding Level 4 for review of past notes/labs, order and interpretation of labs today, and prescription drug management  Final Clinical Impressions(s) / UC Diagnoses   Final diagnoses:  Vaginitis and vulvovaginitis  Routine screening for STI (sexually transmitted infection)     Discharge Instructions      -For your yeast infection, start the Diflucan (fluconazole)-  Take one pill today (day 1). If you're still having symptoms in 3 days, take the second pill.  -We have sent testing for sexually transmitted infections. We will notify you of any positive results once they are received. If required, we will prescribe any medications you might need. Please refrain from all sexual activity until treatment is complete.  -Seek additional medical attention if you develop fevers/chills, new/worsening abdominal pain, new/worsening vaginal discomfort/discharge, etc.       ED Prescriptions     Medication Sig Dispense Auth. Provider   fluconazole (DIFLUCAN) 150 MG tablet Take 1 tablet (150 mg total) by mouth daily. -For your yeast infection, start the Diflucan (fluconazole)- Take one pill today (day 1). If you're still having symptoms in 3 days, take the second pill. 2 tablet Rhys Martini, PA-C      PDMP not reviewed this encounter.   Rhys Martini, PA-C 04/15/21 1752

## 2021-04-16 LAB — CERVICOVAGINAL ANCILLARY ONLY
Bacterial Vaginitis (gardnerella): POSITIVE — AB
Comment: NEGATIVE

## 2021-04-19 ENCOUNTER — Telehealth (HOSPITAL_COMMUNITY): Payer: Self-pay

## 2021-04-19 MED ORDER — METRONIDAZOLE 500 MG PO TABS: 500.0000 mg | ORAL_TABLET | Freq: Two times a day (BID) | ORAL | 0 refills | Status: DC

## 2021-04-26 ENCOUNTER — Ambulatory Visit (HOSPITAL_COMMUNITY)
Admission: EM | Admit: 2021-04-26 | Discharge: 2021-04-26 | Disposition: A | Discharge status: Home / Self Care | Payer: Medicaid Other | Attending: Emergency Medicine | Admitting: Emergency Medicine

## 2021-04-26 ENCOUNTER — Other Ambulatory Visit: Payer: Self-pay

## 2021-04-26 ENCOUNTER — Encounter (HOSPITAL_COMMUNITY): Payer: Self-pay

## 2021-04-26 DIAGNOSIS — Z20822 Contact with and (suspected) exposure to covid-19: Secondary | ICD-10-CM | POA: Insufficient documentation

## 2021-04-26 DIAGNOSIS — J069 Acute upper respiratory infection, unspecified: Secondary | ICD-10-CM | POA: Insufficient documentation

## 2021-04-26 NOTE — Discharge Instructions (Signed)
Please follow the enclosed instructions for care.  If you begin to experience fever, worsening of symptoms, shortness of breath, please return for evaluation and treatment.

## 2021-04-26 NOTE — ED Triage Notes (Signed)
Pt presents with a runny nose and cough x 1 week.   Pt states she has not taken medicine for relief.

## 2021-04-26 NOTE — ED Provider Notes (Signed)
[facility]    CSN: [id number] Arrival date & time: 04/26/21  1945      History   Chief Complaint Chief Complaint  Patient presents with   Nasal Congestion   Sore Throat    HPI Mckenzie Clark is a 20 y.o. female.    Sore Throat  Pt presents with a runny nose, sore throat and nonproductive cough cough x 1 week. Pt states she has not taken medicine for relief.  Patient states that overall she is feeling better at this time, states she needs a note for work and would like to be tested for COVID. History reviewed. No pertinent past medical history.  There are no problems to display for this patient.   History reviewed. No pertinent surgical history.  OB History   No obstetric history on file.      Home Medications    Prior to Admission medications   Medication Sig Start Date End Date Taking? Authorizing Provider  fluconazole (DIFLUCAN) 150 MG tablet Take 1 tablet (150 mg total) by mouth daily. -For your yeast infection, start the Diflucan (fluconazole)- Take one pill today (day 1). If you're still having symptoms in 3 days, take the second pill. 04/15/21   Rhys Martini, PA-C  FLUoxetine (PROZAC) 10 MG capsule Take 1 capsule (10 mg total) by mouth daily. 01/26/21 01/26/22  Oneta Rack, NP  hydrOXYzine (ATARAX/VISTARIL) 50 MG tablet Take 1 tablet (50 mg total) by mouth every 6 (six) hours as needed for anxiety. May take 2 tabs at night 01/21/21   Domenick Gong, MD  medroxyPROGESTERone Acetate (DEPO-PROVERA IM) Inject into the muscle.    [provider]  metroNIDAZOLE (FLAGYL) 500 MG tablet Take 1 tablet (500 mg total) by mouth 2 (two) times daily. 04/19/21   Merrilee Jansky, MD  traZODone (DESYREL) 50 MG tablet Take 1 tablet (50 mg total) by mouth at bedtime. 01/26/21   Oneta Rack, NP    Family History Family History  Problem Relation Age of Onset   Healthy Mother    Healthy Father     Social History Social History   Tobacco Use    Smoking status: Former   Smokeless tobacco: Never  Substance Use Topics   Alcohol use: Never   Drug use: Not Currently     Allergies   Patient has no known allergies.   Review of Systems Review of Systems Per HPI  Physical Exam Triage Vital Signs ED Triage Vitals  Enc Vitals Group     BP 04/26/21 2014 (!) 117/91     Pulse Rate 04/26/21 2013 94     Resp 04/26/21 2013 19     Temp 04/26/21 2014 99.1 F (37.3 C)     Temp Source 04/26/21 2014 Oral     SpO2 04/26/21 2013 97 %     Weight --      Height --      Head Circumference --      Peak Flow --      Pain Score 04/26/21 2012 0     Pain Loc --      Pain Edu? --      Excl. in GC? --    No data found.  Updated Vital Signs BP (!) 117/91 (BP Location: Right Arm)   Pulse 94   Temp 99.1 F (37.3 C) (Oral)   Resp 19   LMP 03/27/2021 (Approximate)   SpO2 97%   Visual Acuity Right Eye Distance:   Left  Eye Distance:   Bilateral Distance:    Right Eye Near:   Left Eye Near:    Bilateral Near:     Physical Exam Constitutional:      Appearance: She is well-developed.  HENT:     Head: Normocephalic and atraumatic.     Right Ear: Tympanic membrane and ear canal normal.     Left Ear: Tympanic membrane and ear canal normal.     Nose: Rhinorrhea present. No congestion.     Mouth/Throat:     Mouth: Mucous membranes are moist.     Pharynx: Oropharynx is clear. No pharyngeal swelling, oropharyngeal exudate, posterior oropharyngeal erythema or uvula swelling.     Tonsils: No tonsillar exudate or tonsillar abscesses. 0 on the right. 0 on the left.  Eyes:     Conjunctiva/sclera: Conjunctivae normal.     Pupils: Pupils are equal, round, and reactive to light.  Cardiovascular:     Rate and Rhythm: Normal rate and regular rhythm.     Heart sounds: Normal heart sounds.  Pulmonary:     Effort: Pulmonary effort is normal.     Breath sounds: Normal breath sounds. No wheezing, rhonchi or rales.  Musculoskeletal:     Cervical  back: Normal range of motion and neck supple.  Lymphadenopathy:     Cervical: No cervical adenopathy.  Neurological:     General: No focal deficit present.     Mental Status: She is alert and oriented to person, place, and time.  Psychiatric:        Mood and Affect: Mood normal.        Behavior: Behavior normal.     UC Treatments / Results  Labs (all labs ordered are listed, but only abnormal results are displayed) Labs Reviewed  SARS CORONAVIRUS 2 (TAT 6-24 HRS)    EKG   Radiology No results found.  Procedures Procedures (including critical care time)  Medications Ordered in UC Medications - No data to display  Initial Impression / Assessment and Plan / UC Course  I have reviewed the triage vital signs and the nursing notes.  Pertinent labs & imaging results that were available during my care of the patient were reviewed by me and considered in my medical decision making (see chart for details).     Patient appears to be suffering from a mild upper respiratory infection at this time which is currently resolving.  Patient asked to be tested for COVID-19.  Patient also asked for a note for work.  Conservative care is recommended.  Note has been provided Final Clinical Impressions(s) / UC Diagnoses   Final diagnoses:  Viral upper respiratory tract infection     Discharge Instructions      Please follow the enclosed instructions for care.  If you begin to experience fever, worsening of symptoms, shortness of breath, please return for evaluation and treatment.     ED Prescriptions   None    PDMP not reviewed this encounter.   Theadora Rama Scales, PA-C 04/26/21 2130

## 2021-04-27 LAB — SARS CORONAVIRUS 2 (TAT 6-24 HRS): SARS Coronavirus 2: NEGATIVE

## 2021-06-19 ENCOUNTER — Encounter (HOSPITAL_COMMUNITY): Payer: Self-pay | Admitting: Emergency Medicine

## 2021-06-19 ENCOUNTER — Other Ambulatory Visit: Payer: Self-pay

## 2021-06-19 ENCOUNTER — Ambulatory Visit (HOSPITAL_COMMUNITY)
Admission: EM | Admit: 2021-06-19 | Discharge: 2021-06-19 | Disposition: A | Discharge status: Home / Self Care | Payer: Medicaid Other | Attending: Family Medicine | Admitting: Family Medicine

## 2021-06-19 DIAGNOSIS — N898 Other specified noninflammatory disorders of vagina: Secondary | ICD-10-CM | POA: Insufficient documentation

## 2021-06-19 DIAGNOSIS — N926 Irregular menstruation, unspecified: Secondary | ICD-10-CM | POA: Insufficient documentation

## 2021-06-19 LAB — POCT URINALYSIS DIPSTICK, ED / UC
Bilirubin Urine: NEGATIVE
Glucose, UA: NEGATIVE mg/dL
Hgb urine dipstick: NEGATIVE
Ketones, ur: NEGATIVE mg/dL
Nitrite: NEGATIVE
Protein, ur: NEGATIVE mg/dL
Specific Gravity, Urine: 1.025 (ref 1.005–1.030)
Urobilinogen, UA: 1 mg/dL (ref 0.0–1.0)
pH: 6.5 (ref 5.0–8.0)

## 2021-06-19 LAB — POC URINE PREG, ED: Preg Test, Ur: NEGATIVE

## 2021-06-19 NOTE — ED Triage Notes (Addendum)
Patient requesting pregnancy test.  Home pregnancy test was positive.  Last menstrual cycle 10/17 and normal per patientpatient reports vaginal discharge-creamy/white

## 2021-06-19 NOTE — Discharge Instructions (Signed)
Your urine pregnancy test was negative today.  We have sent testing for causes of vaginal infections. We will notify you of any positive results once they are received. If required, we will prescribe any medications you might need.  Please refrain from all sexual activity for at least the next seven days.

## 2021-06-20 LAB — CERVICOVAGINAL ANCILLARY ONLY
Bacterial Vaginitis (gardnerella): NEGATIVE
Candida Glabrata: NEGATIVE
Candida Vaginitis: NEGATIVE
Chlamydia: NEGATIVE
Comment: NEGATIVE
Comment: NEGATIVE
Comment: NEGATIVE
Comment: NEGATIVE
Comment: NEGATIVE
Comment: NORMAL
Neisseria Gonorrhea: NEGATIVE
Trichomonas: NEGATIVE

## 2021-06-20 LAB — URINE CULTURE: Culture: <10000 — AB

## 2021-06-20 NOTE — ED Provider Notes (Signed)
[facility]   [id number] 06/19/21 Arrival Time: 1346  ASSESSMENT & PLAN:  1. Menstrual period late   2. Vaginal discharge    UPT negative. Declines serum HCG. Urine culture sent given tr leuk on U/A.    Discharge Instructions      Your urine pregnancy test was negative today.  We have sent testing for causes of vaginal infections. We will notify you of any positive results once they are received. If required, we will prescribe any medications you might need.  Please refrain from all sexual activity for at least the next seven days.     Without s/s of PID.  Labs Reviewed  POCT URINALYSIS DIPSTICK, ED / UC - Abnormal; Notable for the following components:      Result Value   Leukocytes,Ua TRACE (*)    All other components within normal limits  URINE CULTURE  POC URINE PREG, ED  CERVICOVAGINAL ANCILLARY ONLY    Will notify of any positive results. Instructed to refrain from sexual activity for at least seven days.  Reviewed expectations re: course of current medical issues. Questions answered. Outlined signs and symptoms indicating need for more acute intervention. Patient verbalized understanding. After Visit Summary given.   SUBJECTIVE:  Mckenzie Clark is a 20 y.o. female who presents with complaint of vaginal discharge and late menstrual period. No specific aggravating or alleviating factors reported. Denies: urinary frequency, dysuria, and gross hematuria. Afebrile. No abdominal or pelvic pain. Normal PO intake wihout n/v. No genital rashes or lesions. Reports that she is sexually active. OTC treatment: none. Reports + home preg test.   Patient's last menstrual period was 05/27/2021.   OBJECTIVE:  Vitals:   06/19/21 1519  BP: 113/79  Pulse: 86  Resp: 18  Temp: 98.6 F (37 C)  TempSrc: Oral  SpO2: 97%     General appearance: alert, cooperative, appears stated age and no distress Lungs: unlabored respirations; speaks full sentences  without difficulty Back: no CVA tenderness; FROM at waist Abdomen: soft, non-tender GU: deferred Skin: warm and dry Psychological: alert and cooperative; normal mood and affect.  Results for orders placed or performed during the hospital encounter of 06/19/21  POC urine pregnancy  Result Value Ref Range   Preg Test, Ur NEGATIVE NEGATIVE  POC Urinalysis dipstick  Result Value Ref Range   Glucose, UA NEGATIVE NEGATIVE mg/dL   Bilirubin Urine NEGATIVE NEGATIVE   Ketones, ur NEGATIVE NEGATIVE mg/dL   Specific Gravity, Urine 1.025 1.005 - 1.030   Hgb urine dipstick NEGATIVE NEGATIVE   pH 6.5 5.0 - 8.0   Protein, ur NEGATIVE NEGATIVE mg/dL   Urobilinogen, UA 1.0 0.0 - 1.0 mg/dL   Nitrite NEGATIVE NEGATIVE   Leukocytes,Ua TRACE (A) NEGATIVE    Labs Reviewed  POCT URINALYSIS DIPSTICK, ED / UC - Abnormal; Notable for the following components:      Result Value   Leukocytes,Ua TRACE (*)    All other components within normal limits  URINE CULTURE  POC URINE PREG, ED  CERVICOVAGINAL ANCILLARY ONLY    No Known Allergies  History reviewed. No pertinent past medical history. Family History  Problem Relation Age of Onset   Healthy Mother    Healthy Father    Social History   Socioeconomic History   Marital status: Single    Spouse name: Not on file   Number of children: Not on file   Years of education: Not on file   Highest education level: Not on file  Occupational  History   Not on file  Tobacco Use   Smoking status: Former   Smokeless tobacco: Never  Vaping Use   Vaping Use: Never used  Substance and Sexual Activity   Alcohol use: Never   Drug use: Not Currently   Sexual activity: Yes  Other Topics Concern   Not on file  Social History Narrative   ** Merged History Encounter **       ** Merged History Encounter **       Social Determinants of Health   Financial Resource Strain: Not on file  Food Insecurity: Not on file  Transportation Needs: Not on file   Physical Activity: Not on file  Stress: Not on file  Social Connections: Not on file  Intimate Partner Violence: Not on file           [place], MD 06/20/21 [phone]

## 2021-06-23 ENCOUNTER — Ambulatory Visit (HOSPITAL_COMMUNITY): Payer: Medicaid Other

## 2021-08-05 ENCOUNTER — Other Ambulatory Visit: Payer: Self-pay

## 2021-08-05 ENCOUNTER — Encounter (HOSPITAL_COMMUNITY): Payer: Self-pay

## 2021-08-05 ENCOUNTER — Emergency Department (HOSPITAL_COMMUNITY)
Admission: EM | Admit: 2021-08-05 | Discharge: 2021-08-05 | Disposition: A | Discharge status: Home / Self Care | Payer: Medicaid Other | Attending: Emergency Medicine | Admitting: Emergency Medicine

## 2021-08-05 DIAGNOSIS — Z87891 Personal history of nicotine dependence: Secondary | ICD-10-CM | POA: Diagnosis not present

## 2021-08-05 DIAGNOSIS — N912 Amenorrhea, unspecified: Secondary | ICD-10-CM | POA: Insufficient documentation

## 2021-08-05 LAB — POC URINE PREG, ED: Preg Test, Ur: NEGATIVE

## 2021-08-05 NOTE — Discharge Instructions (Signed)
1. Medications: usual home medications 2. Treatment: rest, drink plenty of fluids,  3. Follow Up: Please followup with OB/GYN in 1 week for further work-up. Return to the ER for new or worsening symptoms.

## 2021-08-05 NOTE — ED Provider Notes (Signed)
[facility]EMERGENCY [facility]PT Provider Note   CSN: [id number] Arrival date & time: 08/05/21  0400     History Chief Complaint  Patient presents with   Amenorrhea    Mckenzie Clark is a 20 y.o. female presents to the emergency department with complaints of amenorrhea for the last 3 months.  Reports that previously her periods were regular.  States she is sexually active with 1 female partner.  Denies vaginal discharge, abdominal pain or vaginal bleeding.  She is not currently utilizing a contraceptive method.  Denies previous pregnancies.  Reports took a pregnancy test at home several weeks ago that was "faintly positive" but pregnancy test at urgent care last week was negative.  Patient reports she remains concerned about possible pregnancy.  The history is provided by the patient and medical records. No language interpreter was used.      History reviewed. No pertinent past medical history.  There are no problems to display for this patient.   History reviewed. No pertinent surgical history.   OB History   No obstetric history on file.     Family History  Problem Relation Age of Onset   Healthy Mother    Healthy Father     Social History   Tobacco Use   Smoking status: Former   Smokeless tobacco: Never  Building services engineer Use: Never used  Substance Use Topics   Alcohol use: Never   Drug use: Not Currently    Home Medications Prior to Admission medications   Medication Sig Start Date End Date Taking? Authorizing Provider  fluconazole (DIFLUCAN) 150 MG tablet Take 1 tablet (150 mg total) by mouth daily. -For your yeast infection, start the Diflucan (fluconazole)- Take one pill today (day 1). If you're still having symptoms in 3 days, take the second pill. Patient not taking: Reported on 06/19/2021 04/15/21   Rhys Martini, PA-C  FLUoxetine (PROZAC) 10 MG capsule Take 1 capsule (10 mg total) by mouth daily. Patient not taking: Reported on  06/19/2021 01/26/21 01/26/22  Oneta Rack, NP  hydrOXYzine (ATARAX/VISTARIL) 50 MG tablet Take 1 tablet (50 mg total) by mouth every 6 (six) hours as needed for anxiety. May take 2 tabs at night Patient not taking: Reported on 06/19/2021 01/21/21   Domenick Gong, MD  medroxyPROGESTERone Acetate (DEPO-PROVERA IM) Inject into the muscle.    [provider]  metroNIDAZOLE (FLAGYL) 500 MG tablet Take 1 tablet (500 mg total) by mouth 2 (two) times daily. Patient not taking: Reported on 06/19/2021 04/19/21   Merrilee Jansky, MD  traZODone (DESYREL) 50 MG tablet Take 1 tablet (50 mg total) by mouth at bedtime. Patient not taking: Reported on 06/19/2021 01/26/21   Oneta Rack, NP    Allergies    Patient has no known allergies.  Review of Systems   Review of Systems  Constitutional:  Negative for appetite change, diaphoresis, fatigue, fever and unexpected weight change.  HENT:  Negative for mouth sores.   Eyes:  Negative for visual disturbance.  Respiratory:  Negative for cough, chest tightness, shortness of breath and wheezing.   Cardiovascular:  Negative for chest pain.  Gastrointestinal:  Negative for abdominal pain, constipation, diarrhea, nausea and vomiting.  Endocrine: Negative for polydipsia, polyphagia and polyuria.  Genitourinary:  Positive for menstrual problem. Negative for dysuria, frequency, hematuria and urgency.  Musculoskeletal:  Negative for back pain and neck stiffness.  Skin:  Negative for rash.  Allergic/Immunologic: Negative for immunocompromised state.  Neurological:  Negative for syncope, light-headedness and headaches.  Hematological:  Does not bruise/bleed easily.  Psychiatric/Behavioral:  Negative for sleep disturbance. The patient is not nervous/anxious.    Physical Exam Updated Vital Signs BP 131/87    Pulse 66    Temp 98.1 F (36.7 C) (Oral)    Resp 17    Ht 5\' 1"  (1.549 m)    Wt 65.8 kg    SpO2 98%    BMI 27.40 kg/m   Physical Exam Vitals and  nursing note reviewed.  Constitutional:      General: She is not in acute distress.    Appearance: She is well-developed. She is not ill-appearing.     Comments: Pt drinking water  HENT:     Head: Normocephalic.  Eyes:     General: No scleral icterus.    Conjunctiva/sclera: Conjunctivae normal.  Cardiovascular:     Rate and Rhythm: Normal rate.  Pulmonary:     Effort: Pulmonary effort is normal.  Abdominal:     General: There is no distension.  Musculoskeletal:        General: Normal range of motion.     Cervical back: Normal range of motion.  Skin:    General: Skin is warm and dry.  Neurological:     General: No focal deficit present.     Mental Status: She is alert.  Psychiatric:        Mood and Affect: Mood normal.    ED Results / Procedures / Treatments   Labs (all labs ordered are listed, but only abnormal results are displayed) Labs Reviewed  POC URINE PREG, ED    EKG None  Radiology No results found.  Procedures Procedures   Medications Ordered in ED Medications - No data to display  ED Course  I have reviewed the triage vital signs and the nursing notes.  Pertinent labs & imaging results that were available during my care of the patient were reviewed by me and considered in my medical decision making (see chart for details).    MDM Rules/Calculators/A&P                          Patient presents to the emergency department with concerns for pregnancy.  No abdominal pain, vaginal bleeding or vaginal discharge.  Vital signs within normal limits.  4:40 AM Pregnancy test negative today.  Patient will be referred to outpatient women's center for further amenorrhea work-up.      Final Clinical Impression(s) / ED Diagnoses Final diagnoses:  Amenorrhea    Rx / DC Orders ED Discharge Orders     None        Jazyiah Yiu, Gwenlyn Perking 08/05/21 0440    Quintella Reichert, MD 08/05/21 [phone]

## 2021-08-05 NOTE — ED Triage Notes (Signed)
Pt states that she has not had her period in 3 months. Pt states that she had a positive urine test at home but then tested negative at urgent care.

## 2021-09-04 ENCOUNTER — Other Ambulatory Visit: Payer: Self-pay

## 2021-09-04 ENCOUNTER — Ambulatory Visit (HOSPITAL_COMMUNITY)
Admission: EM | Admit: 2021-09-04 | Discharge: 2021-09-04 | Disposition: A | Discharge status: Home / Self Care | Payer: Medicaid Other | Attending: Urgent Care | Admitting: Urgent Care

## 2021-09-04 ENCOUNTER — Encounter (HOSPITAL_COMMUNITY): Payer: Self-pay | Admitting: Emergency Medicine

## 2021-09-04 DIAGNOSIS — Z3202 Encounter for pregnancy test, result negative: Secondary | ICD-10-CM

## 2021-09-04 DIAGNOSIS — N926 Irregular menstruation, unspecified: Secondary | ICD-10-CM | POA: Diagnosis not present

## 2021-09-04 LAB — POC URINE PREG, ED: Preg Test, Ur: NEGATIVE

## 2021-09-04 NOTE — ED Triage Notes (Signed)
PT reports no menstrual for 4 months. Has had some "faint lines" on pregnancy tests at home with nausea.

## 2021-09-04 NOTE — Discharge Instructions (Addendum)
Your pregnancy test is negative. You need to follow up with a gynecologist to further evaluate your irregular menstrual cycles. Please call the above number to set up an appointment.

## 2021-09-04 NOTE — ED Provider Notes (Signed)
[facility]    CSN: [id number] Arrival date & time: 09/04/21  1620      History   Chief Complaint Chief Complaint  Patient presents with   Possible Pregnancy    HPI Mckenzie Clark is a 21 y.o. female.   Pleasant Mckenzie Clark female comes in primarily requesting a pregnancy test. She states her last menstrual period was in October 2022.  She reports a longstanding history of intermittent irregular menstrual periods and states this is not the first time she is gone several months without a menstrual period.  She is not currently on birth control, she states her last contraception was the Depo-Provera shot, but she is uncertain when her last dose was.  She is uncertain if she even had any in 2022.  She does not have a PCP or gynecologist.  She denies any known health issues and takes no daily medications.  She denies any pelvic pain, cramping, discharge, itching.  She denies a history of thyroid disease or anemia.  She denies any significant weight loss or increased physical activity.  She reports a normal diet and does not abstain from any particular food groups.   Possible Pregnancy   History reviewed. No pertinent past medical history.  There are no problems to display for this patient.   History reviewed. No pertinent surgical history.  OB History   No obstetric history on file.      Home Medications    Prior to Admission medications   Not on File    Family History Family History  Problem Relation Age of Onset   Healthy Mother    Healthy Father     Social History Social History   Tobacco Use   Smoking status: Former   Smokeless tobacco: Never  Building services engineer Use: Never used  Substance Use Topics   Alcohol use: Never   Drug use: Not Currently     Allergies   Patient has no known allergies.   Review of Systems Review of Systems As per hpi  Physical Exam Triage Vital Signs ED Triage Vitals  Enc Vitals Group     BP 09/04/21 1733  103/70     Pulse Rate 09/04/21 1733 98     Resp 09/04/21 1733 16     Temp 09/04/21 1733 98.2 F (36.8 C)     Temp src --      SpO2 09/04/21 1733 98 %     Weight --      Height --      Head Circumference --      Peak Flow --      Pain Score 09/04/21 1731 0     Pain Loc --      Pain Edu? --      Excl. in GC? --    No data found.  Updated Vital Signs BP 103/70    Pulse 98    Temp 98.2 F (36.8 C)    Resp 16    LMP 05/21/2021    SpO2 98%   Visual Acuity Right Eye Distance:   Left Eye Distance:   Bilateral Distance:    Right Eye Near:   Left Eye Near:    Bilateral Near:     Physical Exam Vitals and nursing note reviewed.  Constitutional:      General: She is not in acute distress.    Appearance: Normal appearance. She is well-developed. She is obese. She is not ill-appearing, toxic-appearing or diaphoretic.  HENT:  Head: Normocephalic and atraumatic.     Nose: Nose normal.     Mouth/Throat:     Mouth: Mucous membranes are moist.     Pharynx: No oropharyngeal exudate or posterior oropharyngeal erythema.  Eyes:     General: No scleral icterus.       Right eye: No discharge.        Left eye: No discharge.     Extraocular Movements: Extraocular movements intact.     Conjunctiva/sclera: Conjunctivae normal.     Pupils: Pupils are equal, round, and reactive to light.     Comments: No conjunctival pallor  Cardiovascular:     Rate and Rhythm: Normal rate and regular rhythm.     Pulses: Normal pulses.     Heart sounds: No murmur heard. Pulmonary:     Effort: Pulmonary effort is normal. No respiratory distress.     Breath sounds: Normal breath sounds. No stridor. No wheezing or rhonchi.  Abdominal:     General: Abdomen is flat. Bowel sounds are normal. There is no distension.     Palpations: Abdomen is soft. There is no mass.     Tenderness: There is no abdominal tenderness. There is no right CVA tenderness, left CVA tenderness, guarding or rebound.     Hernia: No  hernia is present.  Musculoskeletal:        General: No swelling or tenderness. Normal range of motion.     Cervical back: Normal range of motion and neck supple. No rigidity or tenderness.  Lymphadenopathy:     Cervical: No cervical adenopathy.  Skin:    General: Skin is warm and dry.     Capillary Refill: Capillary refill takes less than 2 seconds.     Coloration: Skin is not jaundiced.     Findings: No erythema or rash.  Neurological:     General: No focal deficit present.     Mental Status: She is alert.  Psychiatric:        Mood and Affect: Mood normal.     UC Treatments / Results  Labs (all labs ordered are listed, but only abnormal results are displayed) Labs Reviewed  POC URINE PREG, ED    EKG   Radiology No results found.  Procedures Procedures (including critical care time)  Medications Ordered in UC Medications - No data to display  Initial Impression / Assessment and Plan / UC Course  I have reviewed the triage vital signs and the nursing notes.  Pertinent labs & imaging results that were available during my care of the patient were reviewed by me and considered in my medical decision making (see chart for details).     Irregular menses - third negative pregnancy test since October. Other causes discussed with pt - thyroid disease, endometriosis, PCOS, anemia, etc. Pt was given referral information for gynecology as thorough workup warranted. Negative pregnancy test  Final Clinical Impressions(s) / UC Diagnoses   Final diagnoses:  Irregular menstrual cycle  Negative pregnancy test     Discharge Instructions      Your pregnancy test is negative. You need to follow up with a gynecologist to further evaluate your irregular menstrual cycles. Please call the above number to set up an appointment.     ED Prescriptions   None    PDMP not reviewed this encounter.   Maretta Bees, [place] 09/04/21 1933

## 2021-09-06 ENCOUNTER — Emergency Department (HOSPITAL_COMMUNITY)
Admission: EM | Admit: 2021-09-06 | Discharge: 2021-09-06 | Disposition: A | Discharge status: Home / Self Care | Payer: Medicaid Other | Attending: Emergency Medicine | Admitting: Emergency Medicine

## 2021-09-06 ENCOUNTER — Other Ambulatory Visit: Payer: Self-pay

## 2021-09-06 ENCOUNTER — Encounter (HOSPITAL_COMMUNITY): Payer: Self-pay | Admitting: Emergency Medicine

## 2021-09-06 DIAGNOSIS — N912 Amenorrhea, unspecified: Secondary | ICD-10-CM | POA: Diagnosis not present

## 2021-09-06 DIAGNOSIS — N9489 Other specified conditions associated with female genital organs and menstrual cycle: Secondary | ICD-10-CM | POA: Diagnosis not present

## 2021-09-06 DIAGNOSIS — R5383 Other fatigue: Secondary | ICD-10-CM | POA: Diagnosis present

## 2021-09-06 LAB — CBC WITH DIFFERENTIAL/PLATELET
Abs Immature Granulocytes: 0.01 10*3/uL (ref 0.00–0.07)
Basophils Absolute: 0 10*3/uL (ref 0.0–0.1)
Basophils Relative: 0 %
Eosinophils Absolute: 0.1 10*3/uL (ref 0.0–0.5)
Eosinophils Relative: 2 %
HCT: 40.6 % (ref 36.0–46.0)
Hemoglobin: 12.9 g/dL (ref 12.0–15.0)
Immature Granulocytes: 0 %
Lymphocytes Relative: 34 %
Lymphs Abs: 2.3 10*3/uL (ref 0.7–4.0)
MCH: 25.6 pg — ABNORMAL LOW (ref 26.0–34.0)
MCHC: 31.8 g/dL (ref 30.0–36.0)
MCV: 80.6 fL (ref 80.0–100.0)
Monocytes Absolute: 0.3 10*3/uL (ref 0.1–1.0)
Monocytes Relative: 4 %
Neutro Abs: 4 10*3/uL (ref 1.7–7.7)
Neutrophils Relative %: 60 %
Platelets: 320 10*3/uL (ref 150–400)
RBC: 5.04 MIL/uL (ref 3.87–5.11)
RDW: 15.4 % (ref 11.5–15.5)
WBC: 6.8 10*3/uL (ref 4.0–10.5)
nRBC: 0 % (ref 0.0–0.2)

## 2021-09-06 LAB — COMPREHENSIVE METABOLIC PANEL
ALT: 14 U/L (ref 0–44)
AST: 19 U/L (ref 15–41)
Albumin: 4.1 g/dL (ref 3.5–5.0)
Alkaline Phosphatase: 55 U/L (ref 38–126)
Anion gap: 8 (ref 5–15)
BUN: 11 mg/dL (ref 6–20)
CO2: 23 mmol/L (ref 22–32)
Calcium: 9.1 mg/dL (ref 8.9–10.3)
Chloride: 104 mmol/L (ref 98–111)
Creatinine, Ser: 0.59 mg/dL (ref 0.44–1.00)
GFR, Estimated: >60 mL/min (ref >60)
Glucose, Bld: 104 mg/dL — ABNORMAL HIGH (ref 70–99)
Potassium: 3.7 mmol/L (ref 3.5–5.1)
Sodium: 135 mmol/L (ref 135–145)
Total Bilirubin: 0.4 mg/dL (ref 0.3–1.2)
Total Protein: 8 g/dL (ref 6.5–8.1)

## 2021-09-06 LAB — I-STAT BETA HCG BLOOD, ED (MC, WL, AP ONLY): I-stat hCG, quantitative: <5 m[IU]/mL (ref <5)

## 2021-09-06 LAB — TSH: TSH: 0.665 u[IU]/mL (ref 0.350–4.500)

## 2021-09-06 LAB — LIPASE, BLOOD: Lipase: 29 U/L (ref 11–51)

## 2021-09-06 MED ORDER — KETOROLAC TROMETHAMINE 30 MG/ML IJ SOLN
30.0000 mg | Freq: Once | INTRAMUSCULAR | Status: AC
  Administered 2021-09-06: 30 mg via INTRAMUSCULAR
  Filled 2021-09-06: qty 1

## 2021-09-06 NOTE — ED Provider Notes (Signed)
[facility]EMERGENCY [facility]PT Provider Note   CSN: [id number] Arrival date & time: 09/06/21  1810     History No chief complaint on file.   Mckenzie Clark is a 21 y.o. female.  No known past medical history.  Patient presents with symptoms of fatigue, and nausea for 4 months now.  She states she has not had a period for 4 months.  She has been seen by urgent care recently and has had 2 negative pregnancy test.  She says today she had worsening abdominal cramping so she came to the ED.  She is not taking anything for her symptoms.  She has no vomiting, diarrhea, fevers, chills, or abdominal pain.  Patient, she has previously had regular periods monthly prior to them abruptly stopping.  HPI     Home Medications Prior to Admission medications   Not on File      Allergies    Patient has no known allergies.    Review of Systems   Review of Systems  Constitutional:  Positive for fatigue.  Gastrointestinal:  Positive for nausea.  Genitourinary:  Positive for menstrual problem.  All other systems reviewed and are negative.  Physical Exam Updated Vital Signs BP 137/89 (BP Location: Left Arm)    Pulse 83    Temp 98 F (36.7 C) (Oral)    Resp 18    SpO2 92%  Physical Exam Vitals and nursing note reviewed.  Constitutional:      General: She is not in acute distress.    Appearance: Normal appearance. She is well-developed. She is not ill-appearing, toxic-appearing or diaphoretic.  HENT:     Head: Normocephalic and atraumatic.     Nose: No nasal deformity.     Mouth/Throat:     Lips: Pink. No lesions.  Eyes:     General: Gaze aligned appropriately. No scleral icterus.       Right eye: No discharge.        Left eye: No discharge.     Conjunctiva/sclera: Conjunctivae normal.     Right eye: Right conjunctiva is not injected. No exudate or hemorrhage.    Left eye: Left conjunctiva is not injected. No exudate or hemorrhage. Pulmonary:     Effort: Pulmonary effort  is normal. No respiratory distress.  Abdominal:     General: Abdomen is flat. There is no distension.     Palpations: Abdomen is soft. There is no mass.     Tenderness: There is no abdominal tenderness. There is no right CVA tenderness, left CVA tenderness, guarding or rebound.     Hernia: No hernia is present.  Skin:    General: Skin is warm and dry.  Neurological:     Mental Status: She is alert and oriented to person, place, and time.  Psychiatric:        Mood and Affect: Mood normal.        Speech: Speech normal.        Behavior: Behavior normal. Behavior is cooperative.    ED Results / Procedures / Treatments   Labs (all labs ordered are listed, but only abnormal results are displayed) Labs Reviewed  COMPREHENSIVE METABOLIC PANEL - Abnormal; Notable for the following components:      Result Value   Glucose, Bld 104 (*)    All other components within normal limits  CBC WITH DIFFERENTIAL/PLATELET - Abnormal; Notable for the following components:   MCH 25.6 (*)    All other components within normal limits  LIPASE,  BLOOD  TSH  I-STAT BETA HCG BLOOD, ED ([facility], [facility], [facility] ONLY)    EKG None  Radiology No results found.  Procedures Procedures    Medications Ordered in ED Medications  ketorolac (TORADOL) 30 MG/ML injection 30 mg (30 mg Intramuscular Given 09/06/21 2052)    ED Course/ Medical Decision Making/ A&P                           Medical Decision Making Problems Addressed: Amenorrhea: chronic illness or injury  Amount and/or Complexity of Data Reviewed External Data Reviewed: notes.    Details: Reviewed recent urgent care and notes regarding similar presentations Labs: ordered. Decision-making details documented in ED Course.  Risk Prescription drug management.   This is a 21 y.o. female who presents to the ED with four months of amenorrhea, fatigue, nausea, and worsening abdominal cramping.  She had previously had normal menstrual cycles that have been  regular. She has had multiple negative pregnancy test. Vitals are stable. Abdominal exam is reassuring. Patient appears well. Multiple evaluations for this in the past.   I personally reviewed all laboratory work and imaging. Abnormal results outlined below. Labs are overall reassuring.  She has negative pregnancy test.  We will add on a TSH to evaluate for thyroid disorder, however patient will likely benefit from outpatient OB/GYN follow-up.  No acute cause to secondary amenorrhea were found or suspected. The TSH was normal. Patient would benefit with outpatient OBGYN f/u. Referral information provided.   Portions of this note were generated with Scientist, clinical (histocompatibility and immunogenetics). Dictation errors may occur despite best attempts at proofreading.   Final Clinical Impression(s) / ED Diagnoses Final diagnoses:  Amenorrhea    Rx / DC Orders ED Discharge Orders     None         Claudie Leach, PA-C 09/06/21 2355    Terald Sleeper, MD 09/06/21 2355

## 2021-09-06 NOTE — ED Provider Triage Note (Signed)
Emergency Medicine Provider Triage Evaluation Note  Mckenzie Clark , a 21 y.o. female  was evaluated in triage.  Pt complains of fatigue, not having a menstrual cycle. She has not had a menstrual cycle since October per her report.  She was seen at urgent care 2 days ago with a negative urine pregnancy test.  Physical Exam  BP 137/89 (BP Location: Left Arm)    Pulse 83    Temp 98 F (36.7 C) (Oral)    Resp 18    SpO2 92%  Gen:   Awake, no distress   Resp:  Normal effort  MSK:   Moves extremities without difficulty  Other:  Normal speech.   Medical Decision Making  Medically screening exam initiated at 6:40 PM.  Appropriate orders placed.  Mckenzie Clark was informed that the remainder of the evaluation will be completed by another provider, this initial triage assessment does not replace that evaluation, and the importance of remaining in the ED until their evaluation is complete.  Will check basic labs as chart review does not show she has had labs including hemoglobin checked from what I can see.   Cristina Gong, [place] 09/06/21 1842

## 2021-09-06 NOTE — ED Notes (Signed)
Patient left before the discharge process could be completed.  

## 2021-09-06 NOTE — Discharge Instructions (Addendum)
Your labs were unremarkable. Please follow up with OBGYN. The referral information is included in your paperwork.

## 2021-09-06 NOTE — ED Triage Notes (Signed)
Patient reports not having a period for 4 months. Had two negative pregnancy tests at the UC recently.
